# Patient Record
Sex: Male | Born: 1985 | Race: Black or African American | Hispanic: No | Marital: Single | Smoking: Never smoker
Health system: Southern US, Community
[De-identification: ages and names within clinical notes are randomized; demographics above are authoritative.]

---

## 2012-02-21 ENCOUNTER — Emergency Department (HOSPITAL_COMMUNITY)
Admission: EM | Admit: 2012-02-21 | Discharge: 2012-02-21 | Disposition: A | Discharge status: Home / Self Care | Payer: Self-pay | Attending: Emergency Medicine | Admitting: Emergency Medicine

## 2012-02-21 ENCOUNTER — Encounter (HOSPITAL_COMMUNITY): Payer: Self-pay | Admitting: *Deleted

## 2012-02-21 ENCOUNTER — Emergency Department (HOSPITAL_COMMUNITY): Payer: Self-pay

## 2012-02-21 ENCOUNTER — Emergency Department (HOSPITAL_COMMUNITY)
Admission: EM | Admit: 2012-02-21 | Discharge: 2012-02-21 | Disposition: A | Discharge status: Home / Self Care | Payer: Self-pay | Source: Home / Self Care

## 2012-02-21 DIAGNOSIS — R51 Headache: Secondary | ICD-10-CM | POA: Insufficient documentation

## 2012-02-21 DIAGNOSIS — I10 Essential (primary) hypertension: Secondary | ICD-10-CM | POA: Insufficient documentation

## 2012-02-21 LAB — BASIC METABOLIC PANEL
BUN: 6 mg/dL (ref 6–23)
GFR calc Af Amer: >90 mL/min (ref >90)
GFR calc non Af Amer: >90 mL/min (ref >90)
Potassium: 5.1 mEq/L (ref 3.5–5.1)

## 2012-02-21 LAB — CBC
HCT: 43.9 % (ref 39.0–52.0)
MCHC: 34.6 g/dL (ref 30.0–36.0)
Platelets: 198 10*3/uL (ref 150–400)
RDW: 12 % (ref 11.5–15.5)

## 2012-02-21 LAB — URINALYSIS, ROUTINE W REFLEX MICROSCOPIC
Bilirubin Urine: NEGATIVE
Ketones, ur: NEGATIVE mg/dL
Leukocytes, UA: NEGATIVE
Nitrite: NEGATIVE
Specific Gravity, Urine: 1.013 (ref 1.005–1.030)
Urobilinogen, UA: 1 mg/dL (ref 0.0–1.0)

## 2012-02-21 MED ORDER — SODIUM CHLORIDE 0.9 % IV BOLUS (SEPSIS)
1000.0000 mL | Freq: Once | INTRAVENOUS | Status: AC
  Administered 2012-02-21: 1000 mL via INTRAVENOUS

## 2012-02-21 MED ORDER — DIPHENHYDRAMINE HCL 50 MG/ML IJ SOLN
25.0000 mg | Freq: Once | INTRAMUSCULAR | Status: AC
  Administered 2012-02-21: 25 mg via INTRAVENOUS
  Filled 2012-02-21: qty 1

## 2012-02-21 MED ORDER — METOCLOPRAMIDE HCL 5 MG/ML IJ SOLN
20.0000 mg | Freq: Once | INTRAVENOUS | Status: AC
  Administered 2012-02-21: 20 mg via INTRAVENOUS
  Filled 2012-02-21 (×2): qty 4

## 2012-02-21 MED ORDER — BUTALBITAL-APAP-CAFFEINE 50-325-40 MG PO TABS: 1.0000 | ORAL_TABLET | Freq: Four times a day (QID) | ORAL | Status: AC | PRN

## 2012-02-21 NOTE — ED Notes (Signed)
Pt to Ct with tech

## 2012-02-21 NOTE — Discharge Instructions (Signed)
It is very important that you continue to have your headaches managed by a primary care physician.  Please make sure to call on Monday to arrange the next appropriate visit.  Please return to the emergency department for any concerning changes in your condition, such as confusion, persistent headache in spite of medication use, weakness, or anything concerning at all.

## 2012-02-21 NOTE — ED Notes (Signed)
Pt unable to void at this time. 

## 2012-02-21 NOTE — ED Notes (Signed)
Pt given rx x 1, discharge and follow up instructions without further questions after speaking to MD. Denies further needs at this time. Ambulates to lobby in NAD

## 2012-02-21 NOTE — ED Notes (Signed)
Patient with headache since yesterday around 5:30am intermittently.  Patient also went to [facility] and check his BP and it was high 153/100 last night.   States that pain woke him up from sleep.  Patient has had headache in the past but not like the one he is experiencing today

## 2012-02-21 NOTE — ED Notes (Signed)
Pt with family at bedside. C/o pain to left temple since 0530 Friday morning. States he checked BP and found it to be high. Denies h/o elevated BP. Pt denies N/V or visual disturbances. Resting with family at bedside. Lights dimmed, pillow and blanket offered. Denies further needs at this time

## 2012-02-21 NOTE — ED Provider Notes (Signed)
History     CSN: [id number]  Arrival date & time 02/21/12  1756   First MD Initiated Contact with Patient 02/21/12 2012      Chief Complaint  Patient presents with  . Headache    HPI The patient presents with concerns of headache and hypertension.  He denies any prior diagnosis of migraines or of hypertension.  He states that for the past 2 days he has had a headache.  The pain began insidiously approximately 40 hours ago.  Since onset the pain has been waxing/waning, but increasingly severe.  The pain is focally about the left parietal area some radiation towards midline.  The pain is sore, throbbing.  He denies any visual complaints, ataxia, confusion, dysphagia, weakness, fever, chills.  He states the headache becomes worse when exposed to cold air, as in air-conditioning, and improves with exposure to warm temperatures, by stepping outside.  The pain is also improved with Advil and Tylenol, though transiently. After his headache persisted he went to [facility] and took his blood pressure.  He comes to be elevated, which is atypical for him.  He notes a long history of intermittent headaches, though few headaches that are as severe as today's. History reviewed. No pertinent past medical history.  History reviewed. No pertinent past surgical history.  History reviewed. No pertinent family history.  History  Substance Use Topics  . Smoking status: Passive Smoker  . Smokeless tobacco: Not on file  . Alcohol Use: Yes      Review of Systems  Constitutional:       Per HPI, otherwise negative  HENT:       Per HPI, otherwise negative  Eyes: Negative.   Respiratory:       Per HPI, otherwise negative  Cardiovascular:       Per HPI, otherwise negative  Gastrointestinal: Negative for vomiting.  Genitourinary: Negative.   Musculoskeletal:       Per HPI, otherwise negative  Skin: Negative.   Neurological: Negative for syncope.    Allergies  Review of patient's allergies  indicates no known allergies.  Home Medications   Current Outpatient Rx  Name Route Sig Dispense Refill  . ACETAMINOPHEN 325 MG PO TABS Oral Take 650 mg by mouth every 6 (six) hours as needed. For pain    . BC HEADACHE POWDER PO Oral Take 1 packet by mouth every 8 (eight) hours as needed. For headaches    . IBUPROFEN 200 MG PO TABS Oral Take 200 mg by mouth once.      BP 148/96  Pulse 51  Temp 98.2 F (36.8 C) (Oral)  Resp 18  SpO2 100%  Physical Exam  Nursing note and vitals reviewed. Constitutional: He is oriented to person, place, and time. He appears well-developed. No distress.  HENT:  Head: Normocephalic and atraumatic.  Eyes: Conjunctivae and EOM are normal.  Neck: Full passive range of motion without pain. Neck supple. No muscular tenderness present. No edema present.  Cardiovascular: Normal rate and regular rhythm.   Pulmonary/Chest: Effort normal. No stridor. No respiratory distress.  Abdominal: He exhibits no distension.  Musculoskeletal: He exhibits no edema.  Neurological: He is alert and oriented to person, place, and time. He has normal strength. He displays no atrophy and no tremor. No cranial nerve deficit or sensory deficit. He exhibits normal muscle tone. He displays no seizure activity. Coordination and gait normal.  Skin: Skin is warm and dry.  Psychiatric: He has a normal mood and affect.  ED Course  Procedures (including critical care time)  Labs Reviewed  CBC - Abnormal; Notable for the following:    WBC 10.8 (*)     All other components within normal limits  BASIC METABOLIC PANEL - Abnormal; Notable for the following:    Sodium 134 (*)     Glucose, Bld 111 (*)     All other components within normal limits  URINALYSIS, ROUTINE W REFLEX MICROSCOPIC   Ct Head Wo Contrast  02/21/2012  *RADIOLOGY REPORT*  Clinical Data: Headache; hypertension.  Bilateral arm and hand weakness.  CT HEAD WITHOUT CONTRAST  Technique:  Contiguous axial images were  obtained from the base of the skull through the vertex without contrast.  Comparison: None.  Findings: There is no evidence of acute infarction, mass lesion, or intra- or extra-axial hemorrhage on CT.  The posterior fossa, including the cerebellum, brainstem and fourth ventricle, is within normal limits.  The third and lateral ventricles, and basal ganglia are unremarkable in appearance.  The cerebral hemispheres are symmetric in appearance, with normal gray- white differentiation.  No mass effect or midline shift is seen.  There is no evidence of fracture; visualized osseous structures are unremarkable in appearance.  The visualized portions of the orbits are within normal limits.  The paranasal sinuses and mastoid air cells are well-aerated.  No significant soft tissue abnormalities are seen.  IMPRESSION: Unremarkable noncontrast CT of the head.  Original Report Authenticated By: Tonia Ghent, M.D.     No diagnosis found.   On repeat evaluation the patient is sleeping MDM  This young male presents with concerns of headache and hypertension.  Given the patient's description of abnormally elevated blood pressure, there is some suspicion of hypertensive urgency.  The patient's headache characteristics are similar to his multiple prior headaches, though the additional severity is noted.  The patient is in no distress with no focal neurologic deficits on exam.  Absent fever, neck discomfort, neurologic findings there is low suspicion for either meningitis or subarachnoid hemorrhage.  However, the patient had a CT scan dated the lack of prior evaluation for his intermittent headaches and bleeding severity.  The skin and all his labs were unremarkable, reassuring for the low suspicion of ongoing endorgan effects of hypertension.  On repeat evaluation the patient was sleeping.  Given his resolution of symptoms, the patient was discharged in stable condition with explicit instructions to follow up with a primary  care physician, both for evaluation of his headache and to consider medication for hypertension if appropriate as an outpatient.   Gerhard Munch, MD 02/21/12 2330

## 2016-01-07 ENCOUNTER — Encounter (HOSPITAL_BASED_OUTPATIENT_CLINIC_OR_DEPARTMENT_OTHER): Payer: Self-pay | Admitting: *Deleted

## 2016-01-07 ENCOUNTER — Emergency Department (HOSPITAL_BASED_OUTPATIENT_CLINIC_OR_DEPARTMENT_OTHER): Payer: No Typology Code available for payment source

## 2016-01-07 ENCOUNTER — Emergency Department (HOSPITAL_BASED_OUTPATIENT_CLINIC_OR_DEPARTMENT_OTHER)
Admission: EM | Admit: 2016-01-07 | Discharge: 2016-01-07 | Disposition: A | Discharge status: Home / Self Care | Payer: No Typology Code available for payment source | Attending: Emergency Medicine | Admitting: Emergency Medicine

## 2016-01-07 DIAGNOSIS — F17200 Nicotine dependence, unspecified, uncomplicated: Secondary | ICD-10-CM | POA: Diagnosis not present

## 2016-01-07 DIAGNOSIS — S161XXA Strain of muscle, fascia and tendon at neck level, initial encounter: Secondary | ICD-10-CM

## 2016-01-07 DIAGNOSIS — M542 Cervicalgia: Secondary | ICD-10-CM | POA: Insufficient documentation

## 2016-01-07 MED ORDER — IBUPROFEN 800 MG PO TABS: 800.0000 mg | ORAL_TABLET | Freq: Three times a day (TID) | ORAL | Status: DC | PRN

## 2016-01-07 MED ORDER — IBUPROFEN 800 MG PO TABS
800.0000 mg | ORAL_TABLET | Freq: Once | ORAL | Status: AC
  Administered 2016-01-07: 800 mg via ORAL
  Filled 2016-01-07: qty 1

## 2016-01-07 MED ORDER — CYCLOBENZAPRINE HCL 10 MG PO TABS: 10.0000 mg | ORAL_TABLET | Freq: Three times a day (TID) | ORAL | Status: DC | PRN
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
## 2016-04-30 ENCOUNTER — Ambulatory Visit (INDEPENDENT_AMBULATORY_CARE_PROVIDER_SITE_OTHER): Payer: No Typology Code available for payment source | Admitting: Emergency Medicine

## 2016-04-30 ENCOUNTER — Encounter: Payer: Self-pay | Admitting: Emergency Medicine

## 2016-04-30 VITALS — BP 142/80 | HR 72 | Temp 97.5°F | Resp 18 | Ht 71.0 in | Wt 156.0 lb

## 2016-04-30 DIAGNOSIS — L02214 Cutaneous abscess of groin: Secondary | ICD-10-CM

## 2016-04-30 MED ORDER — DOXYCYCLINE HYCLATE 100 MG PO TABS: 100.0000 mg | ORAL_TABLET | Freq: Two times a day (BID) | ORAL | 0 refills | Status: DC

## 2016-04-30 NOTE — Progress Notes (Signed)
By signing my name below, I, Stann Oresung-Kai Tsai, attest that this documentation has been prepared under the direction and in the presence of Lesle ChrisSteven Abdalrahman Clementson, MD. Electronically Signed: Stann Oresung-Kai Tsai, Scribe. 04/30/2016 , 12:22 PM .  Patient was seen in room 4 .  Chief Complaint:  Chief Complaint  Patient presents with  . Recurrent Skin Infections    Left lower abdomen    HPI: Melvin Tucker is a 30 y.o. male [facility] complaining of recurrent skin infections over his left inguinal area that was noticed a week ago. He initially noticed there was a knot over the area. But he sweats a lot at work and the area became irritated. His girlfriend has a similar issue and was treated with doxycycline by Deliah BostonMichael Clark, PA-C.   No past medical history on file. No past surgical history on file. Social History   Social History  . Marital status: Single    Spouse name: N/A  . Number of children: N/A  . Years of education: N/A   Social History Main Topics  . Smoking status: Current Every Day Smoker  . Smokeless tobacco: None  . Alcohol use Yes  . Drug use: No  . Sexual activity: Not Asked   Other Topics Concern  . None   Social History Narrative  . None   No family history on file. No Known Allergies Prior to Admission medications   Not on File     ROS:  Constitutional: negative for fever, chills, night sweats, weight changes, or fatigue  HEENT: negative for vision changes, hearing loss, congestion, rhinorrhea, ST, epistaxis, or sinus pressure Cardiovascular: negative for chest pain or palpitations Respiratory: negative for hemoptysis, wheezing, shortness of breath, or cough Abdominal: negative for abdominal pain, nausea, vomiting, diarrhea, or constipation Dermatological: negative for rash; positive for wound Neurologic: negative for headache, dizziness, or syncope All other systems reviewed and are otherwise negative with the exception to those above and in the  HPI.  PHYSICAL EXAM: Vitals:   04/30/16 1140  BP: (!) 150/80  Pulse: 72  Resp: 18  Temp: 97.5 F (36.4 C)   Body mass index is 21.76 kg/m.   General: Alert, no acute distress HEENT:  Normocephalic, atraumatic, oropharynx patent. [facility] Cardiovascular:  Regular rate and rhythm, no rubs murmurs or gallops.  No Carotid bruits, radial pulse intact. No pedal edema.  Respiratory: Clear to auscultation bilaterally.  No wheezes, rales, or rhonchi.  No cyanosis, no use of accessory musculature Abdominal: No organomegaly, abdomen is soft and non-tender, positive bowel sounds. No masses. Musculoskeletal: Gait intact. No edema, tenderness Skin: No rashes. Neurologic: Facial musculature symmetric. Psychiatric: Patient acts appropriately throughout our interaction.  Lymphatic: No cervical or submandibular lymphadenopathy Genitourinary/Anorectal: 2x2.5cm fluctuant mass left inguinal area Meds ordered this encounter  Medications  . doxycycline (VIBRA-TABS) 100 MG tablet    Sig: Take 1 tablet (100 mg total) by mouth 2 (two) times daily.    Dispense:  20 tablet    Refill:  0   LABS:   EKG/XRAY:     ASSESSMENT/PLAN: Wound culture was sent patient placed on doxycycline after I&D. We'll recheck 48 hours.I personally performed the services described in this documentation, which was scribed in my presence. The recorded information has been reviewed and is accurate.  Gross sideeffects, risk and benefits, and alternatives of medications d/w patient. Patient is aware that all medications have potential sideeffects and we are unable to predict every sideeffect or drug-drug interaction that  may occur.  Lesle ChrisSteven Valma Rotenberg MD 04/30/2016 12:02 PM

## 2016-04-30 NOTE — Progress Notes (Signed)
Risk and benefits discussed and verbal consent obtained. Anesthetic allergies reviewed. Patient anesthetized using 1:1 mix of 2% lidocaine with epi. A 1 cm incision was made using a number 11 blade and purulent material was expressed.  The was wound packed. The patient tolerated the procedure without difficulty.   A clean dressing was placed and wound care instructions were provided.  Deliah BostonMichael Bryceton Hantz, MS, PA-C 12:49 PM, 04/30/2016

## 2016-04-30 NOTE — Patient Instructions (Signed)
     IF you received an x-ray today, you will receive an invoice from [facility]. Please contact [facility] at [phone] with questions or concerns regarding your invoice.   IF you received labwork today, you will receive an invoice from [facility]. Please contact [facility] at [phone] with questions or concerns regarding your invoice.   Our billing staff will not be able to assist you with questions regarding bills from these companies.  You will be contacted with the lab results as soon as they are available. The fastest way to get your results is to activate your My Chart account. Instructions are located on the last page of this paperwork. If you have not heard from us regarding the results in 2 weeks, please contact this office.     Return to office in 2 days for wound check.

## 2016-05-01 ENCOUNTER — Encounter: Payer: Self-pay | Admitting: Physician Assistant

## 2016-05-01 ENCOUNTER — Ambulatory Visit (INDEPENDENT_AMBULATORY_CARE_PROVIDER_SITE_OTHER): Payer: No Typology Code available for payment source | Admitting: Physician Assistant

## 2016-05-01 VITALS — BP 118/86 | HR 71 | Temp 98.9°F | Resp 16

## 2016-05-01 DIAGNOSIS — L84 Corns and callosities: Secondary | ICD-10-CM

## 2016-05-01 NOTE — Progress Notes (Signed)
   05/01/2016 1:56 PM   DOB: 04/25/1986 / MRN: [id number]  SUBJECTIVE:  Melvin Tucker is a 30 y.o. male presenting for mutiple calluses that formed on his feet starting about 4 months ago and they are worsening.  Reports he went for a pedicure and they could not help him.  Denies any difficulty with ambulation.     He has No Known Allergies.   He  has no past medical history on file.    He  reports that he has been smoking.  He does not have any smokeless tobacco history on file. He reports that he drinks alcohol. He reports that he does not use drugs. He  has no sexual activity history on file. The patient  has no past surgical history on file.  His family history is not on file.  Review of Systems  Constitutional: Negative for fever.  Skin: Positive for rash.    The problem list and medications were reviewed and updated by myself where necessary and exist elsewhere in the encounter.   OBJECTIVE:  BP 118/86 (BP Location: Right Arm, Patient Position: Sitting, Cuff Size: Normal)   Pulse 71   Temp 98.9 F (37.2 C)   Resp 16   SpO2 99%   Physical Exam  Constitutional: Vital signs are normal.  Musculoskeletal:       Feet:    Results for orders placed or performed in visit on 04/30/16 (from the past 72 hour(s))  WOUND CULTURE     Status: None (Preliminary result)   Collection Time: 04/30/16 12:39 PM  Result Value Ref Range   Gram Stain Few    Gram Stain WBC present-both PMN and Mononuclear    Gram Stain No Squamous Epithelial Cells Seen    Gram Stain No Organisms Seen     No results found.  ASSESSMENT AND PLAN  Gaspar Garbelfred was seen today for mass.  Diagnoses and all orders for this visit:  Foot callus: I have no training in callus removal. Will refer to podiatry for foot care.      The patient is advised to call or return to clinic if he does not see an improvement in symptoms, or to seek the care of the closest emergency department if he worsens with the above plan.    Deliah BostonMichael Kailin Leu, MHS, PA-C [facility] 05/01/2016 1:56 PM

## 2016-05-01 NOTE — Patient Instructions (Signed)
     IF you received an x-ray today, you will receive an invoice from [facility]. Please contact [facility] at [phone] with questions or concerns regarding your invoice.   IF you received labwork today, you will receive an invoice from [facility]. Please contact [facility] at [phone] with questions or concerns regarding your invoice.   Our billing staff will not be able to assist you with questions regarding bills from these companies.  You will be contacted with the lab results as soon as they are available. The fastest way to get your results is to activate your My Chart account. Instructions are located on the last page of this paperwork. If you have not heard from us regarding the results in 2 weeks, please contact this office.      

## 2016-05-02 ENCOUNTER — Ambulatory Visit (INDEPENDENT_AMBULATORY_CARE_PROVIDER_SITE_OTHER): Payer: No Typology Code available for payment source | Admitting: Physician Assistant

## 2016-05-02 VITALS — BP 136/80 | HR 72 | Temp 97.4°F | Resp 18 | Ht 71.0 in | Wt 156.0 lb

## 2016-05-02 DIAGNOSIS — L02214 Cutaneous abscess of groin: Secondary | ICD-10-CM

## 2016-05-02 NOTE — Progress Notes (Signed)
   Melvin Tucker  MRN: [id number] DOB: 12/05/1985  PCP: No PCP Per Patient  Subjective:  Pt is a 30 year old male presenting to clinic 2 days post abscess I&D.  Melvin BostonMichael Clark, PA-C performed the I & D and prescribed a 10-day course of Doxycycline 100mg  BID. Patient reports compliance with medication. He says his wound was bleeding a lot the first day, but has since subsided and he has not noticed any drainage in the area.   Denies fever, chills, nausea, vomiting, radiating pain.    Review of Systems  Constitutional: Negative for chills, diaphoresis, fatigue and fever.  Cardiovascular: Negative.   Gastrointestinal: Negative.   Skin: Positive for wound (healing abscess, left groin). Negative for color change, pallor and rash.  Neurological: Negative.     There are no active problems to display for this patient.   Current Outpatient Prescriptions on File Prior to Visit  Medication Sig Dispense Refill  . doxycycline (VIBRA-TABS) 100 MG tablet Take 1 tablet (100 mg total) by mouth 2 (two) times daily. 20 tablet 0   No current facility-administered medications on file prior to visit.     No Known Allergies  Objective:  BP 136/80   Pulse 72   Temp 97.4 F (36.3 C) (Oral)   Resp 18   Ht 5\' 11"  (1.803 m)   Wt 156 lb (70.8 kg)   SpO2 100%   BMI 21.76 kg/m   Physical Exam  Constitutional: He is oriented to person, place, and time and well-developed, well-nourished, and in no distress. No distress.  Cardiovascular: Normal rate, regular rhythm and normal heart sounds.   Neurological: He is alert and oriented to person, place, and time. GCS score is 15.  Skin: Skin is warm and dry.     Psychiatric: Mood, memory, affect and judgment normal.  Vitals reviewed.   Procedure Wound irrigated with 10cc saline and packed with 1/4 inch packing. Wound dressed and wound care discussed.  Reviewed Wound culture result. No organism seem on gram stain.   Assessment and Plan :  1. Abscess  of left groin - Wound was irrigated and re-packed with 1/4 in gauze. RTC in 2 days for follow-up wound check.  - Reviewed wound culture result. No organism seem on gram stain. Discontinue current course of doxycycline. Patient understands and agrees.    Marco CollieWhitney Adream Parzych, PA-C  [facility] 05/02/2016 10:52 AM

## 2016-05-02 NOTE — Patient Instructions (Addendum)
Your wound is healing well. Keep it clean and dry.  Return in two days for wound re-check.  Continue taking your current course of antibiotics.     Abscess An abscess is an infected area that contains a collection of pus and debris.It can occur in almost any part of the body. An abscess is also known as a furuncle or boil. CAUSES  An abscess occurs when tissue gets infected. This can occur from blockage of oil or sweat glands, infection of hair follicles, or a minor injury to the skin. As the body tries to fight the infection, pus collects in the area and creates pressure under the skin. This pressure causes pain. People with weakened immune systems have difficulty fighting infections and get certain abscesses more often.  SYMPTOMS Usually an abscess develops on the skin and becomes a painful mass that is red, warm, and tender. If the abscess forms under the skin, you may feel a moveable soft area under the skin. Some abscesses break open (rupture) on their own, but most will continue to get worse without care. The infection can spread deeper into the body and eventually into the bloodstream, causing you to feel ill.  DIAGNOSIS  Your caregiver will take your medical history and perform a physical exam. A sample of fluid may also be taken from the abscess to determine what is causing your infection. TREATMENT  Your caregiver may prescribe antibiotic medicines to fight the infection. However, taking antibiotics alone usually does not cure an abscess. Your caregiver may need to make a small cut (incision) in the abscess to drain the pus. In some cases, gauze is packed into the abscess to reduce pain and to continue draining the area. HOME CARE INSTRUCTIONS   Only take over-the-counter or prescription medicines for pain, discomfort, or fever as directed by your caregiver.  If you were prescribed antibiotics, take them as directed. Finish them even if you start to feel better.  If gauze is used,  follow your caregiver's directions for changing the gauze.  To avoid spreading the infection:  Keep your draining abscess covered with a bandage.  Wash your hands well.  Do not share personal care items, towels, or whirlpools with others.  Avoid skin contact with others.  Keep your skin and clothes clean around the abscess.  Keep all follow-up appointments as directed by your caregiver. SEEK MEDICAL CARE IF:   You have increased pain, swelling, redness, fluid drainage, or bleeding.  You have muscle aches, chills, or a general ill feeling.  You have a fever. MAKE SURE YOU:   Understand these instructions.  Will watch your condition.  Will get help right away if you are not doing well or get worse.   This information is not intended to replace advice given to you by your health care provider. Make sure you discuss any questions you have with your health care provider.   Document Released: 06/04/2005 Document Revised: 02/24/2012 Document Reviewed: 11/07/2011 Elsevier Interactive Patient Education 2016 [employer].    IF you received an x-ray today, you will receive an invoice from [facility]. Please contact [facility] at [phone] with questions or concerns regarding your invoice.   IF you received labwork today, you will receive an invoice from [facility]. Please contact [facility] at [phone] with questions or concerns regarding your invoice.   Our billing staff will not be able to assist you with questions regarding bills from these companies.  You will be contacted with  the lab results as soon as they are available. The fastest way to get your results is to activate your My Chart account. Instructions are located on the last page of this paperwork. If you have not heard from [place] regarding the results in 2 weeks, please contact this office.

## 2016-05-03 LAB — WOUND CULTURE
GRAM STAIN: NONE SEEN
Gram Stain: NONE SEEN

## 2016-05-05 ENCOUNTER — Ambulatory Visit (INDEPENDENT_AMBULATORY_CARE_PROVIDER_SITE_OTHER): Payer: No Typology Code available for payment source | Admitting: Physician Assistant

## 2016-05-05 VITALS — BP 120/80 | HR 72 | Temp 98.4°F | Resp 18

## 2016-05-05 DIAGNOSIS — Z4889 Encounter for other specified surgical aftercare: Secondary | ICD-10-CM

## 2016-05-05 DIAGNOSIS — L02214 Cutaneous abscess of groin: Secondary | ICD-10-CM

## 2016-05-05 NOTE — Progress Notes (Signed)
Patient ID: Melvin Tucker, male   DOB: 1986/07/04, 30 y.o.   MRN: [id number] [facility] [address], [place] [address] [phone]  Date:  05/05/2016   Name:  Melvin Tucker   DOB:  May 10, 1986   MRN:  [id number]  PCP:  No PCP Per Patient   By signing my name below, I, Charline Bills, attest that this documentation has been prepared under the direction and in the presence of Trena Platt, PA-C Electronically Signed: Charline Bills, ED Scribe 05/05/2016 at 3:11 PM.  History of Present Illness:  Melvin Tucker is a 30 y.o. male patient who presents to [facility] for a wound check to the left groin. Pt was seen in the office on 04/30/16 and had an abscess to his left groin lanced and was started on a 10-day course of doxycycline 100 mg BID. He had a follow-up appointment on 05/02/16 to have the area irrigated and packing was placed at that visit. Since then, pt has noticed drainage and bleeding from the area while showering, however, he reports that pain has improved overall. He denies fever.   There are no active problems to display for this patient.   No past medical history on file.  No past surgical history on file.  Social History  Substance Use Topics   Smoking status: Current Every Day Smoker   Smokeless tobacco: Not on file   Alcohol use Yes    No family history on file.  No Known Allergies  Medication list has been reviewed and updated.  Current Outpatient Prescriptions on File Prior to Visit  Medication Sig Dispense Refill   doxycycline (VIBRA-TABS) 100 MG tablet Take 1 tablet (100 mg total) by mouth 2 (two) times daily. 20 tablet 0   No current facility-administered medications on file prior to visit.     Review of Systems  Constitutional: Negative for fever.  Skin:       + Wound to L groin    Physical Examination: BP 120/80    Pulse 72    Temp 98.4 F (36.9 C) (Oral)    Resp 18    SpO2 99%  Ideal Body Weight:  @FLOWAMB ([id number])@  Physical Exam  Constitutional: He is oriented to person, place, and time. He appears well-developed and well-nourished. No distress.  HENT:  Head: Normocephalic and atraumatic.  Eyes: Conjunctivae and EOM are normal. Pupils are equal, round, and reactive to light.  Cardiovascular: Normal rate.   Pulmonary/Chest: Effort normal. No respiratory distress.  Neurological: He is alert and oriented to person, place, and time.  Skin: Skin is warm and dry. He is not diaphoretic.  On the L side there is a wound; beefy red tissue is apparent once packing was removed. Wound site has a depth of less than .7 cm. No purulent mucus or necrotic tissue apparent. No erythema surrounding the open wound. There is 1 mobile inguinal lymph node just adj to the wound site.   Psychiatric: He has a normal mood and affect. His behavior is normal.    Assessment and Plan: Melvin Tucker is a 30 y.o. male who is here today   Left groin abscess.  Wound cleansed and covered.  Discussed wound care for home. This is healing well.  No packing was placed due to shallow wound depth  rtc as needed.  Advised to continue abx.  Abscess of left groin  Encounter for postoperative wound care  Trena Platt, PA-C [facility]  05/05/2016 2:55 PM  I personally performed the services described in this documentation, which was scribed in my presence. The recorded information has been reviewed and is accurate.

## 2016-05-05 NOTE — Patient Instructions (Addendum)
I would like you to take care of this with washing twice a day. He will keep a dressing on it after you cleanse it. Please use regular soap and water. You can use a non-fragranted or hypoallergenic soap. Do not use deodorant soap. You can use antibacterial soap. Continue her antibiotic until completion. If you are witnessing any increased pain, swelling, purulent drainage, fever, nausea, dizziness, etc.--please return.    IF you received an x-ray today, you will receive an invoice from [facility]. Please contact [facility] at [phone] with questions or concerns regarding your invoice.   IF you received labwork today, you will receive an invoice from [facility]. Please contact [facility] at [phone] with questions or concerns regarding your invoice.   Our billing staff will not be able to assist you with questions regarding bills from these companies.  You will be contacted with the lab results as soon as they are available. The fastest way to get your results is to activate your My Chart account. Instructions are located on the last page of this paperwork. If you have not heard from us regarding the results in 2 weeks, please contact this office.

## 2019-08-30 ENCOUNTER — Ambulatory Visit
Admission: EM | Admit: 2019-08-30 | Discharge: 2019-08-30 | Disposition: A | Discharge status: Home / Self Care | Payer: Self-pay | Attending: Physician Assistant | Admitting: Physician Assistant

## 2019-08-30 ENCOUNTER — Ambulatory Visit (INDEPENDENT_AMBULATORY_CARE_PROVIDER_SITE_OTHER): Payer: Self-pay

## 2019-08-30 DIAGNOSIS — S0990XA Unspecified injury of head, initial encounter: Secondary | ICD-10-CM

## 2019-08-30 DIAGNOSIS — W2211XA Striking against or struck by driver side automobile airbag, initial encounter: Secondary | ICD-10-CM

## 2019-08-30 DIAGNOSIS — M542 Cervicalgia: Secondary | ICD-10-CM

## 2019-08-30 DIAGNOSIS — M545 Low back pain, unspecified: Secondary | ICD-10-CM

## 2019-08-30 DIAGNOSIS — M5489 Other dorsalgia: Secondary | ICD-10-CM

## 2019-08-30 DIAGNOSIS — M62830 Muscle spasm of back: Secondary | ICD-10-CM

## 2019-08-30 MED ORDER — METHOCARBAMOL 500 MG PO TABS: 500.0000 mg | ORAL_TABLET | Freq: Two times a day (BID) | ORAL | 0 refills | Status: AC

## 2019-08-30 MED ORDER — IBUPROFEN 800 MG PO TABS: 800.0000 mg | ORAL_TABLET | Freq: Three times a day (TID) | ORAL | 0 refills | Status: AC
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
